# Patient Record
Sex: Female | Born: 1970 | Race: White | Hispanic: No | Marital: Married | Smoking: Former smoker
Health system: Southern US, Community
[De-identification: ages and names within clinical notes are randomized; demographics above are authoritative.]

---

## 2015-02-27 ENCOUNTER — Other Ambulatory Visit: Payer: Self-pay | Admitting: Family Medicine

## 2015-02-27 DIAGNOSIS — R591 Generalized enlarged lymph nodes: Secondary | ICD-10-CM

## 2015-03-02 ENCOUNTER — Ambulatory Visit
Admission: RE | Admit: 2015-03-02 | Discharge: 2015-03-02 | Disposition: A | Discharge status: Home / Self Care | Payer: Commercial Managed Care - PPO | Source: Ambulatory Visit | Attending: Family Medicine | Admitting: Family Medicine

## 2015-03-02 ENCOUNTER — Other Ambulatory Visit: Payer: Self-pay | Admitting: Family Medicine

## 2015-03-02 DIAGNOSIS — R591 Generalized enlarged lymph nodes: Secondary | ICD-10-CM

## 2015-03-02 MED ORDER — GADOBENATE DIMEGLUMINE 529 MG/ML IV SOLN
14.0000 mL | Freq: Once | INTRAVENOUS | Status: AC | PRN
  Administered 2015-03-02: 14 mL via INTRAVENOUS

## 2015-03-12 ENCOUNTER — Other Ambulatory Visit (HOSPITAL_COMMUNITY): Payer: Self-pay | Admitting: Family Medicine

## 2015-03-12 DIAGNOSIS — R591 Generalized enlarged lymph nodes: Secondary | ICD-10-CM

## 2015-03-16 ENCOUNTER — Other Ambulatory Visit: Payer: Self-pay | Admitting: Radiology

## 2015-03-17 ENCOUNTER — Encounter (HOSPITAL_COMMUNITY): Payer: Self-pay

## 2015-03-17 ENCOUNTER — Ambulatory Visit (HOSPITAL_COMMUNITY)
Admission: RE | Admit: 2015-03-17 | Discharge: 2015-03-17 | Disposition: A | Discharge status: Home / Self Care | Payer: Commercial Managed Care - PPO | Source: Ambulatory Visit | Attending: Family Medicine | Admitting: Family Medicine

## 2015-03-17 DIAGNOSIS — R591 Generalized enlarged lymph nodes: Secondary | ICD-10-CM | POA: Insufficient documentation

## 2015-03-17 DIAGNOSIS — Z87891 Personal history of nicotine dependence: Secondary | ICD-10-CM | POA: Insufficient documentation

## 2015-03-17 DIAGNOSIS — R590 Localized enlarged lymph nodes: Secondary | ICD-10-CM | POA: Diagnosis not present

## 2015-03-17 DIAGNOSIS — Z8249 Family history of ischemic heart disease and other diseases of the circulatory system: Secondary | ICD-10-CM | POA: Diagnosis not present

## 2015-03-17 LAB — CBC WITH DIFFERENTIAL/PLATELET
BASOS ABS: 0 10*3/uL (ref 0.0–0.1)
BASOS PCT: 1 %
Eosinophils Absolute: 0.2 10*3/uL (ref 0.0–0.7)
Eosinophils Relative: 2 %
HEMATOCRIT: 40.3 % (ref 36.0–46.0)
Hemoglobin: 13.4 g/dL (ref 12.0–15.0)
LYMPHS PCT: 31 %
Lymphs Abs: 2 10*3/uL (ref 0.7–4.0)
MCH: 29.1 pg (ref 26.0–34.0)
MCHC: 33.3 g/dL (ref 30.0–36.0)
MCV: 87.6 fL (ref 78.0–100.0)
MONO ABS: 0.6 10*3/uL (ref 0.1–1.0)
Monocytes Relative: 10 %
NEUTROS ABS: 3.7 10*3/uL (ref 1.7–7.7)
Neutrophils Relative %: 56 %
PLATELETS: 243 10*3/uL (ref 150–400)
RBC: 4.6 MIL/uL (ref 3.87–5.11)
RDW: 12.4 % (ref 11.5–15.5)
WBC: 6.4 10*3/uL (ref 4.0–10.5)

## 2015-03-17 LAB — PROTIME-INR
INR: 0.97 (ref 0.00–1.49)
PROTHROMBIN TIME: 13.1 s (ref 11.6–15.2)

## 2015-03-17 MED ORDER — FLUMAZENIL 0.5 MG/5ML IV SOLN
INTRAVENOUS | Status: AC
  Filled 2015-03-17: qty 5

## 2015-03-17 MED ORDER — NALOXONE HCL 0.4 MG/ML IJ SOLN
INTRAMUSCULAR | Status: AC
  Filled 2015-03-17: qty 1

## 2015-03-17 MED ORDER — MIDAZOLAM HCL 2 MG/2ML IJ SOLN
INTRAMUSCULAR | Status: AC | PRN
  Administered 2015-03-17 (×2): 0.5 mg via INTRAVENOUS
  Administered 2015-03-17: 1 mg via INTRAVENOUS

## 2015-03-17 MED ORDER — FENTANYL CITRATE (PF) 100 MCG/2ML IJ SOLN
INTRAMUSCULAR | Status: AC | PRN
  Administered 2015-03-17: 50 ug via INTRAVENOUS
  Administered 2015-03-17: 25 ug via INTRAVENOUS

## 2015-03-17 MED ORDER — MIDAZOLAM HCL 2 MG/2ML IJ SOLN
INTRAMUSCULAR | Status: AC
  Filled 2015-03-17: qty 4

## 2015-03-17 MED ORDER — SODIUM CHLORIDE 0.9 % IV SOLN
INTRAVENOUS | Status: DC
  Administered 2015-03-17: 12:00:00 via INTRAVENOUS

## 2015-03-17 MED ORDER — FENTANYL CITRATE (PF) 100 MCG/2ML IJ SOLN
INTRAMUSCULAR | Status: AC
  Filled 2015-03-17: qty 2

## 2015-03-17 NOTE — Sedation Documentation (Signed)
Patient denies pain and is resting comfortably.  

## 2015-03-17 NOTE — Discharge Instructions (Signed)
Needle Biopsy, Care After These instructions give you information about caring for yourself after your procedure. Your doctor may also give you more specific instructions. Call your doctor if you have any problems or questions after your procedure. HOME CARE  Rest as told by your doctor.  Take medicines only as told by your doctor.  There are many different ways to close and cover the biopsy site, including stitches (sutures), skin glue, and adhesive strips. Follow instructions from your doctor about:  How to take care of your biopsy site.  When and how you should change your bandage (dressing).  When you should remove your dressing.  Removing whatever was used to close your biopsy site.  Check your biopsy site every day for signs of infection. Watch for:  Redness, swelling, or pain.  Fluid, blood, or pus. GET HELP IF:  You have a fever.  You have redness, swelling, or pain at the biopsy site, and it lasts longer than a few days.  You have fluid, blood, or pus coming from the biopsy site.  You feel sick to your stomach (nauseous).  You throw up (vomit). GET HELP RIGHT AWAY IF:  You are short of breath.  You have trouble breathing.  Your chest hurts.  You feel dizzy or you pass out (faint).  You have bleeding that does not stop with pressure or a bandage.  You cough up blood.  Your belly (abdomen) hurts.   This information is not intended to replace advice given to you by your health care provider. Make sure you discuss any questions you have with your health care provider.   Document Released: 01/14/2008 Document Revised: 06/17/2014 Document Reviewed: 01/27/2014 Elsevier Interactive Patient Education 2016 [employer].  May remove bandaid and shower in 24 hours.  Keep wound clean and dry.  Report signs of infection.   Moderate Conscious Sedation, Adult Sedation is the use of medicines to promote relaxation and relieve discomfort and anxiety. Moderate  conscious sedation is a type of sedation. Under moderate conscious sedation you are less alert than normal but are still able to respond to instructions or stimulation. Moderate conscious sedation is used during short medical and dental procedures. It is milder than deep sedation or general anesthesia and allows you to return to your regular activities sooner. LET [facility] CARE PROVIDER KNOW ABOUT:   Any allergies you have.  All medicines you are taking, including vitamins, herbs, eye drops, creams, and over-the-counter medicines.  Use of steroids (by mouth or creams).  Previous problems you or members of your family have had with the use of anesthetics.  Any blood disorders you have.  Previous surgeries you have had.  Medical conditions you have.  Possibility of pregnancy, if this applies.  Use of cigarettes, alcohol, or illegal drugs. RISKS AND COMPLICATIONS Generally, this is a safe procedure. However, as with any procedure, problems can occur. Possible problems include:  Oversedation.  Trouble breathing on your own. You may need to have a breathing tube until you are awake and breathing on your own.  Allergic reaction to any of the medicines used for the procedure. BEFORE THE PROCEDURE  You may have blood tests done. These tests can help show how well your kidneys and liver are working. They can also show how well your blood clots.  A physical exam will be done.  Only take medicines as directed by your health care provider. You may need to stop taking medicines (such as blood thinners, aspirin, or nonsteroidal anti-inflammatory  drugs) before the procedure.   Do not eat or drink at least 6 hours before the procedure or as directed by your health care provider.  Arrange for a responsible adult, family member, or friend to take you home after the procedure. He or she should stay with you for at least 24 hours after the procedure, until the medicine has worn  off. PROCEDURE   An intravenous (IV) catheter will be inserted into one of your veins. Medicine will be able to flow directly into your body through this catheter. You may be given medicine through this tube to help prevent pain and help you relax.  The medical or dental procedure will be done. AFTER THE PROCEDURE  You will stay in a recovery area until the medicine has worn off. Your blood pressure and pulse will be checked.   Depending on the procedure you had, you may be allowed to go home when you can tolerate liquids and your pain is under control.   This information is not intended to replace advice given to you by your health care provider. Make sure you discuss any questions you have with your health care provider.   Document Released: 10/26/2000 Document Revised: 02/21/2014 Document Reviewed: 10/08/2012 Elsevier Interactive Patient Education 2016 [employer]  Moderate Conscious Sedation, Adult, Care After Refer to this sheet in the next few weeks. These instructions provide you with information on caring for yourself after your procedure. Your health care provider may also give you more specific instructions. Your treatment has been planned according to current medical practices, but problems sometimes occur. Call your health care provider if you have any problems or questions after your procedure. WHAT TO EXPECT AFTER THE PROCEDURE  After your procedure:  You may feel sleepy, clumsy, and have poor balance for several hours.  Vomiting may occur if you eat too soon after the procedure. HOME CARE INSTRUCTIONS  Do not participate in any activities where you could become injured for at least 24 hours. Do not:  Drive.  Swim.  Ride a bicycle.  Operate heavy machinery.  Cook.  Use power tools.  Climb ladders.  Work from a high place.  Do not make important decisions or sign legal documents until you are improved.  If you vomit, drink water, juice, or soup when  you can drink without vomiting. Make sure you have little or no nausea before eating solid foods.  Only take over-the-counter or prescription medicines for pain, discomfort, or fever as directed by your health care provider.  Make sure you and your family fully understand everything about the medicines given to you, including what side effects may occur.  You should not drink alcohol, take sleeping pills, or take medicines that cause drowsiness for at least 24 hours.  If you smoke, do not smoke without supervision.  If you are feeling better, you may resume normal activities 24 hours after you were sedated.  Keep all appointments with your health care provider. SEEK MEDICAL CARE IF:  Your skin is pale or bluish in color.  You continue to feel nauseous or vomit.  Your pain is getting worse and is not helped by medicine.  You have bleeding or swelling.  You are still sleepy or feeling clumsy after 24 hours. SEEK IMMEDIATE MEDICAL CARE IF:  You develop a rash.  You have difficulty breathing.  You develop any type of allergic problem.  You have a fever. MAKE SURE YOU:  Understand these instructions.  Will watch your condition.  Will get help right away if you are not doing well or get worse.   This information is not intended to replace advice given to you by your health care provider. Make sure you discuss any questions you have with your health care provider.   Document Released: 11/21/2012 Document Revised: 02/21/2014 Document Reviewed: 11/21/2012 Elsevier Interactive Patient Education [employer].

## 2015-03-17 NOTE — H&P (Signed)
Chief Complaint: Patient was seen in consultation today for US guided left neck mass/lymph node biopsy  Referring Physician(s): Wilson,Fred H  History of Present Illness: Kristin Atkinson is a 45 y.o. female with no significant PMH who presents today for image guided biopsy of a left neck mass/enlarged lymph node that has been present since 01/2015 with no change in size following antibiotic therapy.   History reviewed. No pertinent past medical history.  History reviewed. No pertinent past surgical history.  Allergies: Review of patient's allergies indicates not on file.  Medications: Prior to Admission medications   Medication Sig Start Date End Date Taking? Authorizing Provider  cholecalciferol (VITAMIN D) 1000 units tablet Take 5,000 Units by mouth daily.   Yes Historical Provider, MD  meclizine (ANTIVERT) 25 MG tablet Take 25 mg by mouth 3 (three) times daily as needed for dizziness.    Historical Provider, MD     Family History  Problem Relation Age of Onset  . Hypertension Father   . Hypertension Mother     Social History   Social History  . Marital Status: Married    Spouse Name: N/A  . Number of Children: N/A  . Years of Education: N/A   Social History Main Topics  . Smoking status: Former Smoker    Types: Cigarettes    Quit date: 02/15/2000  . Smokeless tobacco: None  . Alcohol Use: No  . Drug Use: None  . Sexual Activity: Not Asked   Other Topics Concern  . None   Social History Narrative  . None      Review of Systems  Constitutional: Negative for fever, chills and unexpected weight change.  HENT: Negative for sore throat and trouble swallowing.   Respiratory: Negative for cough and shortness of breath.   Cardiovascular: Negative for chest pain.  Gastrointestinal: Negative for nausea, vomiting, abdominal pain and blood in stool.  Genitourinary: Negative for dysuria and hematuria.  Musculoskeletal: Negative for back pain.  Neurological:  Negative for headaches.    Vital Signs: BP 131/88 mmHg  Pulse 74  Temp(Src) 99 F (37.2 C) (Oral)  Resp 18  SpO2 98%  Physical Exam  Constitutional: She is oriented to person, place, and time. She appears well-developed and well-nourished.  Cardiovascular: Normal rate and regular rhythm.   Pulmonary/Chest: Effort normal and breath sounds normal.  Abdominal: Soft. Bowel sounds are normal. There is no tenderness.  Musculoskeletal: Normal range of motion. She exhibits no edema.  Lymphadenopathy:    She has cervical adenopathy.  Neurological: She is alert and oriented to person, place, and time.    Mallampati Score:     Imaging: Mr Neck Soft Tissue Only W Wo Contrast  03/02/2015  ADDENDUM REPORT: 03/02/2015 16:38 ADDENDUM: Study discussed by telephone with PA Kathee Delton in the office of Dr. Benedetto Goad on 03/02/2015 at 1622 hours. Electronically Signed   By: Odessa Fleming M.D.   On: 03/02/2015 16:38  03/02/2015  CLINICAL DATA:  45 year old female with left neck mass discovered 3 weeks ago. No known injury. Initial encounter. EXAM: MR NECK SOFT TISSUE ONLY WITHOUT AND WITH CONTRAST TECHNIQUE: Multiplanar, multisequence MR imaging was performed both before and after administration of intravenous contrast. CONTRAST:  14mL MULTIHANCE GADOBENATE DIMEGLUMINE 529 MG/ML IV SOLN COMPARISON:  None. FINDINGS: The palpable area of concern is marked in the region of the left submandibular space (series 7, image 10). And there is a STIR hyperintense, enhancing oval soft tissue mass underlying the skin marker which  most resembles an abnormal left level IIa lymph node encompassing 20 x 18 x 34 mm (AP by transverse by CC). This is adjacent 2 but appears separate from the left submandibular gland which is mildly displaced anteriorly (arrow on series 8, image 12). Submandibular glands signal and enhancement appears to remain symmetric and normal. The nearby left parotid gland and left parotid space also appears  spared. In addition, there is an asymmetric size and number of cervical lymph nodes tracking from the left level 2B station caudally toward the left subclavian vein (see series 4, images 20 and 21, and compare to the contralateral image 6). The left level 1B station also is mildly affected. These nodes are also hyper enhancing. The intervening fat and surrounding soft tissue planes appear to remain normal. The left level 4 station appears spared and there is no superior mediastinal lymphadenopathy. The thyroid, larynx, pharynx, parapharyngeal spaces, retropharyngeal space, and sublingual spaces are within normal limits. Other soft tissue spaces of the visible neck and face (e.g. visible masticator spaces) appear normal. Visualized paranasal sinuses and mastoids are clear. Normal visualized bone marrow signal. Negative visualized brain parenchyma and spinal cord. Negative lung apices. IMPRESSION: 1. Palpable soft tissue mass most resembles an abnormal 3 cm left level IIa lymph node. Furthermore, there is mild generalized left cervical lymphadenopathy. The appearance is nonspecific with no evidence of a regional cellulitis and no primary Head/Neck tumor identified. Therefore this may be an Infectious Lymphadenitis. Lymphoma or malignant nodal disease from a regional skin cancer or unknown primary are less likely. Trial of Antibiotic therapy is recommended first, but if no resolution then Ultrasound-guided Needle Biopsy of the dominant lesion should be feasible and is recommended. 2. Otherwise negative neck MRI. Electronically Signed: By: Odessa Fleming M.D. On: 03/02/2015 16:16    Labs:  CBC:  Recent Labs  03/17/15 1146  WBC 6.4  HGB 13.4  HCT 40.3  PLT 243    COAGS:  Recent Labs  03/17/15 1146  INR 0.97    BMP: No results for input(s): NA, K, CL, CO2, GLUCOSE, BUN, CALCIUM, CREATININE, GFRNONAA, GFRAA in the last 8760 hours.  Invalid input(s): CMP  LIVER FUNCTION TESTS: No results for input(s):  BILITOT, AST, ALT, ALKPHOS, PROT, ALBUMIN in the last 8760 hours.  TUMOR MARKERS: No results for input(s): AFPTM, CEA, CA199, CHROMGRNA in the last 8760 hours.  Assessment and Plan: 45 yo WF with no significant PMH who presents today for image guided biopsy of a left neck mass/enlarged lymph node (level IIa) that has been present since 01/2015 with no change in size following antibiotic therapy. Risks and benefits discussed with the patient/husband including, but not limited to bleeding, infection, damage to adjacent structures or low yield requiring additional tests. All of the patient's questions were answered, patient is agreeable to proceed.Consent signed and in chart.     Thank you for this interesting consult.  I greatly enjoyed meeting Kristin Atkinson and look forward to participating in their care.  A copy of this report was sent to the requesting provider on this date.  Electronically Signed: D. Jeananne Rama 03/17/2015, 12:47 PM   I spent a total of 15 minutes in face to face in clinical consultation, greater than 50% of which was counseling/coordinating care for US guided left neck mass/lymph node biopsy

## 2015-03-17 NOTE — Procedures (Signed)
US core bx L cervical LAN  No complication No blood loss. See complete dictation in [facility] P[facility].  

## 2016-06-14 IMAGING — MR MR NECK SOFT TISSUE ONLY WO/W CM
5 of 11 series · 21 of 48 positions shown · IV contrast (multihance)
Comparison: None.

ADDENDUM:
Study discussed by telephone with PA Loreal Bohn in the office of
Dr. KAULINAWA SAKARIA on 03/02/2015 at 4666 hours.
CLINICAL DATA: 44-year-old female with left neck mass discovered 3
weeks ago. No known injury. Initial encounter.

EXAM:
MR NECK SOFT TISSUE ONLY WITHOUT AND WITH CONTRAST
TECHNIQUE: Multiplanar, multisequence MR imaging was performed both before and
after administration of intravenous contrast.
CONTRAST:  14mL MULTIHANCE GADOBENATE DIMEGLUMINE 529 MG/ML IV SOLN

[Series 2: T1 · sagittal · 5.0mm · 0.47mm/px · 4 of 25 slices shown (1 of 3)]
[im 1/25]
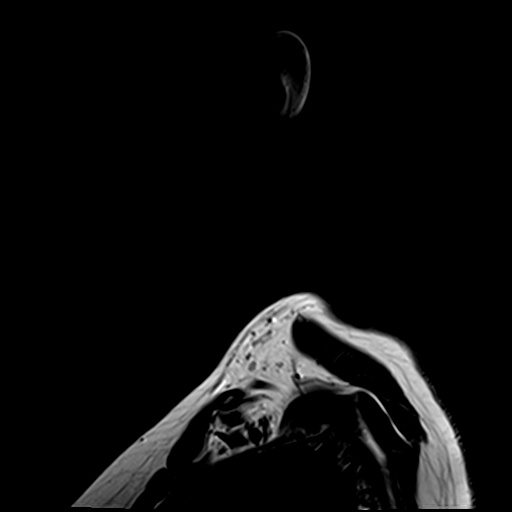
[im 9/25]
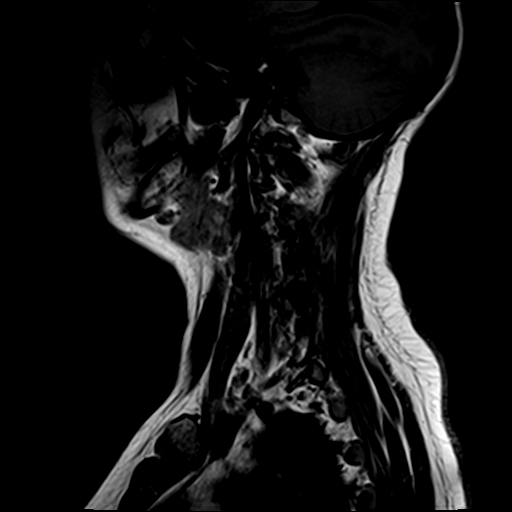
[im 17/25]
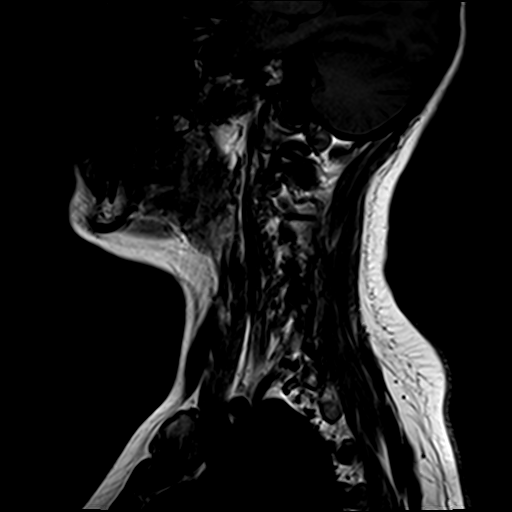
[im 25/25]
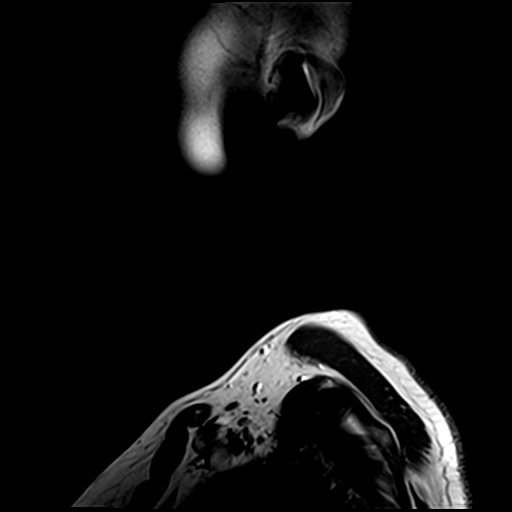

[Series 3: T2 fat-sat · sagittal · 5.0mm · 0.47mm/px · 4 of 25 slices shown]
[im 1/25]
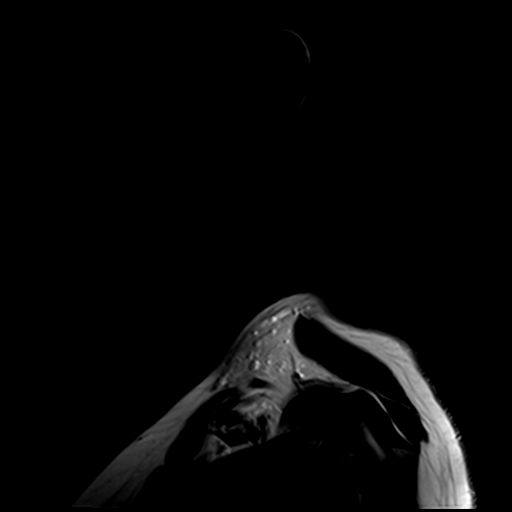
[im 9/25]
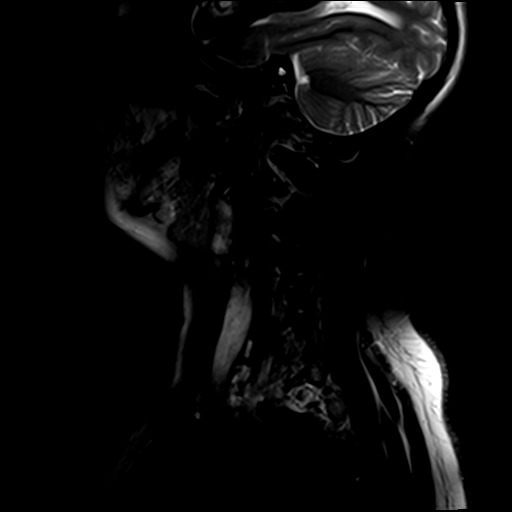
[im 17/25]
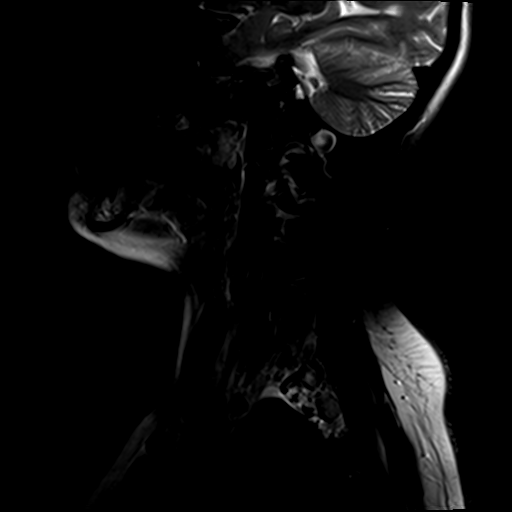
[im 25/25]
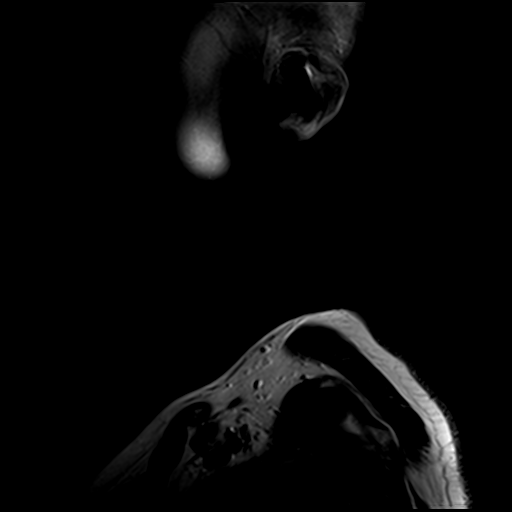

[Series 5: T1 · coronal · 5.0mm · 0.94mm/px · 4 of 24 slices shown (2 of 3)]
[im 1/24]
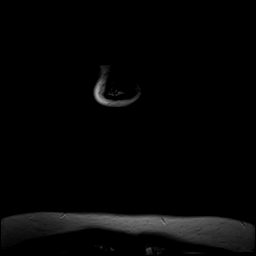
[im 8/24]
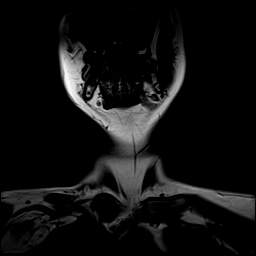
[im 16/24]
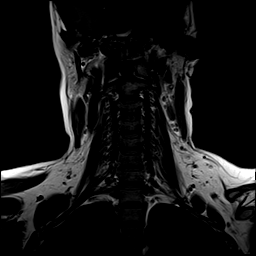
[im 24/24]
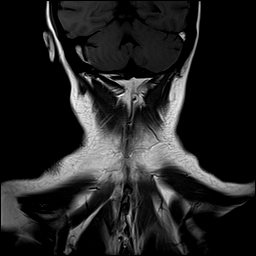

[Series 7: T1 · axial · 5.5mm · 0.43mm/px · z∈[-106,+81]mm · 5 of 30 slices shown (3 of 3)]
[im 1/30]
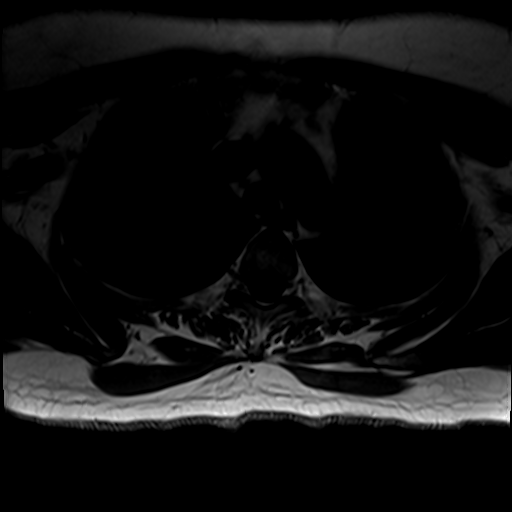
[im 8/30]
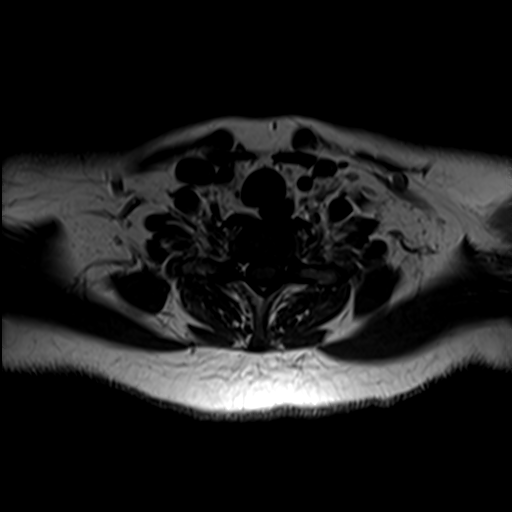
[im 15/30]
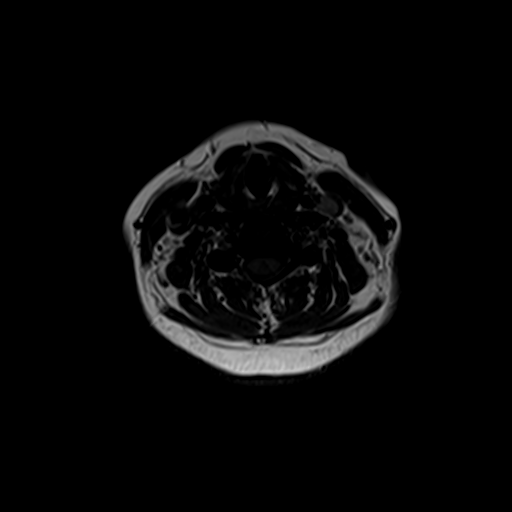
[im 22/30]
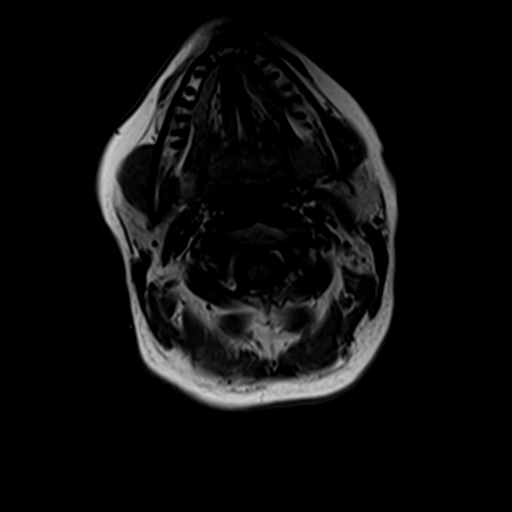
[im 30/30]
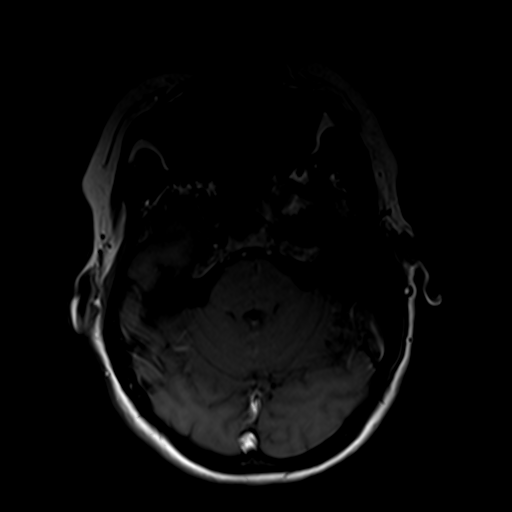

[Series 9: T1 fat-sat · axial · non-contrast · 5.5mm · 0.43mm/px · z∈[-106,+29]mm · 4 of 30 slices shown]
[im 1/30]
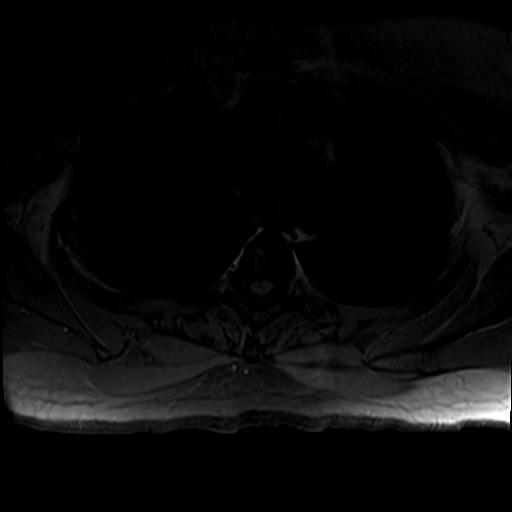
[im 8/30]
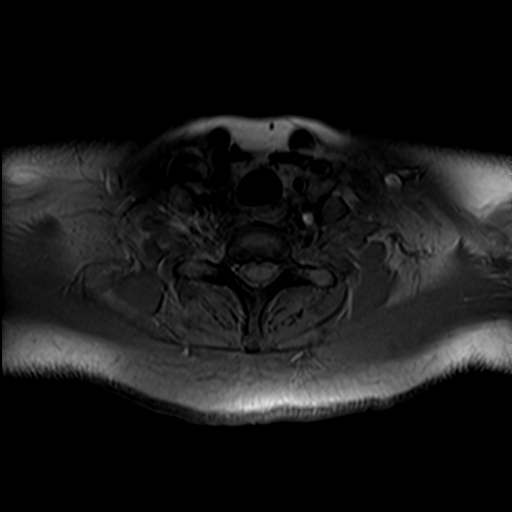
[im 15/30]
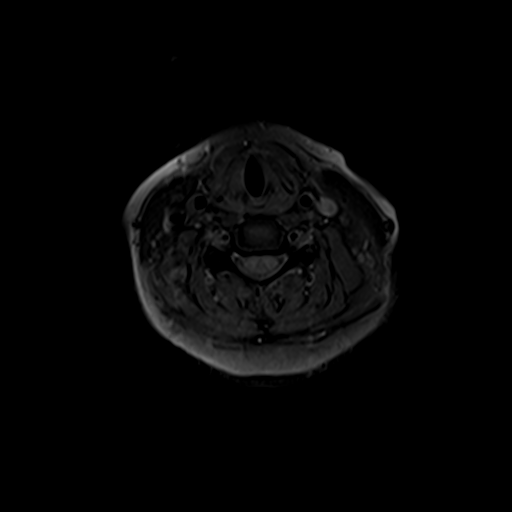
[im 22/30]
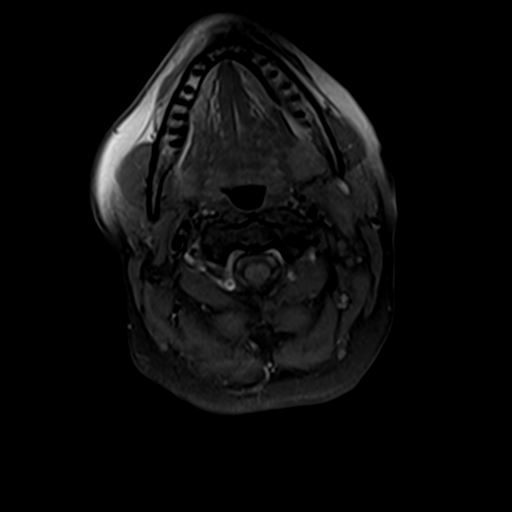

[21 of 48 positions shown; findings below may reference images not displayed]

FINDINGS: The palpable area of concern is marked in the region of the left
submandibular space (series 7, image 10). And there is a STIR
hyperintense, enhancing oval soft tissue mass underlying the skin
marker which most resembles an abnormal left level IIa lymph node
encompassing 20 x 18 x 34 mm (AP by transverse by CC). This is
adjacent 2 but appears separate from the left submandibular gland
which is mildly displaced anteriorly (arrow on series 8, image 12).
Submandibular glands signal and enhancement appears to remain
symmetric and normal. The nearby left parotid gland and left parotid
space also appears spared.

In addition, there is an asymmetric size and number of cervical
lymph nodes tracking from the left level 2B station caudally toward
the left subclavian vein (see series 4, images 20 and 21, and
compare to the contralateral image 6). The left level 1B station
also is mildly affected. These nodes are also hyper enhancing. The
intervening fat and surrounding soft tissue planes appear to remain
normal. The left level 4 station appears spared and there is no
superior mediastinal lymphadenopathy.

The thyroid, larynx, pharynx, parapharyngeal spaces, retropharyngeal
space, and sublingual spaces are within normal limits. Other soft
tissue spaces of the visible neck and face (e.g. visible masticator
spaces) appear normal. Visualized paranasal sinuses and mastoids are
clear. Normal visualized bone marrow signal. Negative visualized
brain parenchyma and spinal cord. Negative lung apices.
IMPRESSION: 1. Palpable soft tissue mass most resembles an abnormal 3 cm left
level IIa lymph node. Furthermore, there is mild generalized left
cervical lymphadenopathy.
The appearance is nonspecific with no evidence of a regional
cellulitis and no primary Head/Neck tumor identified. Therefore this
may be an Infectious Lymphadenitis. Lymphoma or malignant nodal
disease from a regional skin cancer or unknown primary are less
likely.
Trial of Antibiotic therapy is recommended first, but if no
resolution then Ultrasound-guided Needle Biopsy of the dominant
lesion should be feasible and is recommended.
2. Otherwise negative neck MRI.
